# Patient Record
Sex: Female | Born: 1990 | Hispanic: Yes | Marital: Single
Health system: Southern US, Community
[De-identification: ages and names within clinical notes are randomized; demographics above are authoritative.]

---

## 2015-08-16 ENCOUNTER — Emergency Department (HOSPITAL_COMMUNITY): Payer: Self-pay

## 2015-08-16 ENCOUNTER — Emergency Department (HOSPITAL_COMMUNITY)
Admission: EM | Admit: 2015-08-16 | Discharge: 2015-08-16 | Disposition: A | Discharge status: Home / Self Care | Payer: Self-pay | Attending: Emergency Medicine | Admitting: Emergency Medicine

## 2015-08-16 DIAGNOSIS — M62830 Muscle spasm of back: Secondary | ICD-10-CM | POA: Insufficient documentation

## 2015-08-16 DIAGNOSIS — Z3202 Encounter for pregnancy test, result negative: Secondary | ICD-10-CM | POA: Insufficient documentation

## 2015-08-16 LAB — URINALYSIS, ROUTINE W REFLEX MICROSCOPIC
BILIRUBIN URINE: NEGATIVE
Glucose, UA: NEGATIVE mg/dL
Ketones, ur: NEGATIVE mg/dL
NITRITE: NEGATIVE
Protein, ur: NEGATIVE mg/dL
SPECIFIC GRAVITY, URINE: 1.021 (ref 1.005–1.030)
pH: 5 (ref 5.0–8.0)

## 2015-08-16 LAB — CBC WITH DIFFERENTIAL/PLATELET
Basophils Absolute: 0 10*3/uL (ref 0.0–0.1)
Basophils Relative: 1 %
EOS PCT: 4 %
Eosinophils Absolute: 0.3 10*3/uL (ref 0.0–0.7)
HCT: 34.1 % — ABNORMAL LOW (ref 36.0–46.0)
Hemoglobin: 12 g/dL (ref 12.0–15.0)
LYMPHS ABS: 1.9 10*3/uL (ref 0.7–4.0)
LYMPHS PCT: 31 %
MCH: 30.5 pg (ref 26.0–34.0)
MCHC: 35.2 g/dL (ref 30.0–36.0)
MCV: 86.8 fL (ref 78.0–100.0)
MONO ABS: 0.5 10*3/uL (ref 0.1–1.0)
MONOS PCT: 8 %
Neutro Abs: 3.4 10*3/uL (ref 1.7–7.7)
Neutrophils Relative %: 56 %
PLATELETS: 250 10*3/uL (ref 150–400)
RBC: 3.93 MIL/uL (ref 3.87–5.11)
RDW: 12.1 % (ref 11.5–15.5)
WBC: 6.1 10*3/uL (ref 4.0–10.5)

## 2015-08-16 LAB — COMPREHENSIVE METABOLIC PANEL
ALBUMIN: 3.9 g/dL (ref 3.5–5.0)
ALT: 12 U/L — AB (ref 14–54)
AST: 20 U/L (ref 15–41)
Alkaline Phosphatase: 53 U/L (ref 38–126)
Anion gap: 10 (ref 5–15)
BUN: 7 mg/dL (ref 6–20)
CHLORIDE: 110 mmol/L (ref 101–111)
CO2: 22 mmol/L (ref 22–32)
Calcium: 9 mg/dL (ref 8.9–10.3)
Creatinine, Ser: 0.72 mg/dL (ref 0.44–1.00)
GFR calc Af Amer: >60 mL/min (ref >60)
GFR calc non Af Amer: >60 mL/min (ref >60)
GLUCOSE: 101 mg/dL — AB (ref 65–99)
POTASSIUM: 3.8 mmol/L (ref 3.5–5.1)
Sodium: 142 mmol/L (ref 135–145)
Total Bilirubin: 0.2 mg/dL — ABNORMAL LOW (ref 0.3–1.2)
Total Protein: 6.5 g/dL (ref 6.5–8.1)

## 2015-08-16 LAB — POC URINE PREG, ED: PREG TEST UR: NEGATIVE

## 2015-08-16 LAB — URINE MICROSCOPIC-ADD ON

## 2015-08-16 LAB — LIPASE, BLOOD: Lipase: 32 U/L (ref 11–51)

## 2015-08-16 MED ORDER — MORPHINE SULFATE (PF) 4 MG/ML IV SOLN
4.0000 mg | Freq: Once | INTRAVENOUS | Status: AC
  Administered 2015-08-16: 4 mg via INTRAVENOUS
  Filled 2015-08-16: qty 1

## 2015-08-16 MED ORDER — NAPROXEN 500 MG PO TABS: 500.0000 mg | ORAL_TABLET | Freq: Two times a day (BID) | ORAL | Status: AC

## 2015-08-16 MED ORDER — CYCLOBENZAPRINE HCL 10 MG PO TABS: 10.0000 mg | ORAL_TABLET | Freq: Two times a day (BID) | ORAL | Status: AC | PRN

## 2015-08-16 MED ORDER — ONDANSETRON HCL 4 MG/2ML IJ SOLN
4.0000 mg | Freq: Once | INTRAMUSCULAR | Status: AC
  Administered 2015-08-16: 4 mg via INTRAVENOUS
  Filled 2015-08-16: qty 2

## 2015-08-16 NOTE — ED Notes (Signed)
Sharp rt flank abd pain that has been constant since she woke up this am she is on her period now  No  Vomiting, no hx of ov ovarian cysts, last  bm was last night, denies constipation

## 2015-08-16 NOTE — Discharge Instructions (Signed)
Your CT scan was negative for kidney stones or other abnormality.  You will be discharged with antiinflammatory medication and a muscle relaxer. Please read the information below about the medications, your diagnosis, and home management. Also read the reasons to seek immediate medical care at the ER.    Back Injury Prevention Back injuries can be very painful. They can also be difficult to heal. After having one back injury, you are more likely to injure your back again. It is important to learn how to avoid injuring or re-injuring your back. The following tips can help you to prevent a back injury. WHAT SHOULD I KNOW ABOUT PHYSICAL FITNESS?  Exercise for 30 minutes per day on most days of the week or as directed by your health care provider. Make sure to:  Do aerobic exercises, such as walking, jogging, biking, or swimming.  Do exercises that increase balance and strength, such as tai chi and yoga. These can decrease your risk of falling and injuring your back.  Do stretching exercises to help with flexibility.  Try to develop strong abdominal muscles. Your abdominal muscles provide a lot of the support that is needed by your back.  Maintain a healthy weight. This helps to decrease your risk of a back injury. WHAT SHOULD I KNOW ABOUT MY DIET?  Talk with your health care provider about your overall diet. Take supplements and vitamins only as directed by your health care provider.  Talk with your health care provider about how much calcium and vitamin D you need each day. These nutrients help to prevent weakening of the bones (osteoporosis). Osteoporosis can cause broken (fractured) bones, which lead to back pain.  Include good sources of calcium in your diet, such as dairy products, green leafy vegetables, and products that have had calcium added to them (fortified).  Include good sources of vitamin D in your diet, such as milk and foods that are fortified with vitamin D. WHAT SHOULD I  KNOW ABOUT MY POSTURE?  Sit up straight and stand up straight. Avoid leaning forward when you sit or hunching over when you stand.  Choose chairs that have good low-back (lumbar) support.  If you work at a desk, sit close to it so you do not need to lean over. Keep your chin tucked in. Keep your neck drawn back, and keep your elbows bent at a right angle. Your arms should look like the letter "L."  Sit high and close to the steering wheel when you drive. Add a lumbar support to your car seat, if needed.  Avoid sitting or standing in one position for very long. Take breaks to get up, stretch, and walk around at least one time every hour. Take breaks every hour if you are driving for long periods of time.  Sleep on your side with your knees slightly bent, or sleep on your back with a pillow under your knees. Do not lie on the front of your body to sleep. WHAT SHOULD I KNOW ABOUT LIFTING, TWISTING, AND REACHING? Lifting and Heavy Lifting  Avoid heavy lifting, especially repetitive heavy lifting. If you must do heavy lifting:  Stretch before lifting.  Work slowly.  Rest between lifts.  Use a tool such as a cart or a dolly to move objects if one is available.  Make several small trips instead of carrying one heavy load.  Ask for help when you need it, especially when moving big objects.  Follow these steps when lifting:  Stand with your feet  shoulder-width apart.  Get as close to the object as you can. Do not try to pick up a heavy object that is far from your body.  Use handles or lifting straps if they are available.  Bend at your knees. Squat down, but keep your heels off the floor.  Keep your shoulders pulled back, your chin tucked in, and your back straight.  Lift the object slowly while you tighten the muscles in your legs, abdomen, and buttocks. Keep the object as close to the center of your body as possible.  Follow these steps when putting down a heavy load:  Stand  with your feet shoulder-width apart.  Lower the object slowly while you tighten the muscles in your legs, abdomen, and buttocks. Keep the object as close to the center of your body as possible.  Keep your shoulders pulled back, your chin tucked in, and your back straight.  Bend at your knees. Squat down, but keep your heels off the floor.  Use handles or lifting straps if they are available. Twisting and Reaching  Avoid lifting heavy objects above your waist.  Do not twist at your waist while you are lifting or carrying a load. If you need to turn, move your feet.  Do not bend over without bending at your knees.  Avoid reaching over your head, across a table, or for an object on a high surface. WHAT ARE SOME OTHER TIPS? 1. Avoid wet floors and icy ground. Keep sidewalks clear of ice to prevent falls. 2. Do not sleep on a mattress that is too soft or too hard. 3. Keep items that are used frequently within easy reach. 4. Put heavier objects on shelves at waist level, and put lighter objects on lower or higher shelves. 5. Find ways to decrease your stress, such as exercise, massage, or relaxation techniques. Stress can build up in your muscles. Tense muscles are more vulnerable to injury. 6. Talk with your health care provider if you feel anxious or depressed. These conditions can make back pain worse. 7. Wear flat heel shoes with cushioned soles. 8. Avoid sudden movements. 9. Use both shoulder straps when carrying a backpack. 10. Do not use any tobacco products, including cigarettes, chewing tobacco, or electronic cigarettes. If you need help quitting, ask your health care provider.   This information is not intended to replace advice given to you by your health care provider. Make sure you discuss any questions you have with your health care provider.   Document Released: 06/22/2004 Document Revised: 09/29/2014 Document Reviewed: 05/19/2014 Elsevier Interactive Patient Education 2016  [employer]  Back Exercises The following exercises strengthen the muscles that help to support the back. They also help to keep the lower back flexible. Doing these exercises can help to prevent back pain or lessen existing pain. If you have back pain or discomfort, try doing these exercises 2-3 times each day or as told by your health care provider. When the pain goes away, do them once each day, but increase the number of times that you repeat the steps for each exercise (do more repetitions). If you do not have back pain or discomfort, do these exercises once each day or as told by your health care provider. EXERCISES Single Knee to Chest Repeat these steps 3-5 times for each leg:  Lie on your back on a firm bed or the floor with your legs extended.  Bring one knee to your chest. Your other leg should stay extended and in contact  with the floor.  Hold your knee in place by grabbing your knee or thigh.  Pull on your knee until you feel a gentle stretch in your lower back.  Hold the stretch for 10-30 seconds.  Slowly release and straighten your leg. Pelvic Tilt Repeat these steps 5-10 times:  Lie on your back on a firm bed or the floor with your legs extended.  Bend your knees so they are pointing toward the ceiling and your feet are flat on the floor.  Tighten your lower abdominal muscles to press your lower back against the floor. This motion will tilt your pelvis so your tailbone points up toward the ceiling instead of pointing to your feet or the floor.  With gentle tension and even breathing, hold this position for 5-10 seconds. Cat-Cow Repeat these steps until your lower back becomes more flexible:  Get into a hands-and-knees position on a firm surface. Keep your hands under your shoulders, and keep your knees under your hips. You may place padding under your knees for comfort.  Let your head hang down, and point your tailbone toward the floor so your lower back becomes  rounded like the back of a cat.  Hold this position for 5 seconds.  Slowly lift your head and point your tailbone up toward the ceiling so your back forms a sagging arch like the back of a cow.  Hold this position for 5 seconds. Press-Ups Repeat these steps 5-10 times:  Lie on your abdomen (face-down) on the floor.  Place your palms near your head, about shoulder-width apart.  While you keep your back as relaxed as possible and keep your hips on the floor, slowly straighten your arms to raise the top half of your body and lift your shoulders. Do not use your back muscles to raise your upper torso. You may adjust the placement of your hands to make yourself more comfortable.  Hold this position for 5 seconds while you keep your back relaxed.  Slowly return to lying flat on the floor. Bridges Repeat these steps 10 times:  Lie on your back on a firm surface.  Bend your knees so they are pointing toward the ceiling and your feet are flat on the floor.  Tighten your buttocks muscles and lift your buttocks off of the floor until your waist is at almost the same height as your knees. You should feel the muscles working in your buttocks and the back of your thighs. If you do not feel these muscles, slide your feet 1-2 inches farther away from your buttocks.  Hold this position for 3-5 seconds.  Slowly lower your hips to the starting position, and allow your buttocks muscles to relax completely. If this exercise is too easy, try doing it with your arms crossed over your chest. Abdominal Crunches Repeat these steps 5-10 times: 11. Lie on your back on a firm bed or the floor with your legs extended. 12. Bend your knees so they are pointing toward the ceiling and your feet are flat on the floor. 41. Cross your arms over your chest. 14. Tip your chin slightly toward your chest without bending your neck. 20. Tighten your abdominal muscles and slowly raise your trunk (torso) high enough to  lift your shoulder blades a tiny bit off of the floor. Avoid raising your torso higher than that, because it can put too much stress on your low back and it does not help to strengthen your abdominal muscles. 16. Slowly return to your  starting position. Back Lifts Repeat these steps 5-10 times: 1. Lie on your abdomen (face-down) with your arms at your sides, and rest your forehead on the floor. 2. Tighten the muscles in your legs and your buttocks. 3. Slowly lift your chest off of the floor while you keep your hips pressed to the floor. Keep the back of your head in line with the curve in your back. Your eyes should be looking at the floor. 4. Hold this position for 3-5 seconds. 5. Slowly return to your starting position. SEEK MEDICAL CARE IF:  Your back pain or discomfort gets much worse when you do an exercise.  Your back pain or discomfort does not lessen within 2 hours after you exercise. If you have any of these problems, stop doing these exercises right away. Do not do them again unless your health care provider says that you can. SEEK IMMEDIATE MEDICAL CARE IF:  You develop sudden, severe back pain. If this happens, stop doing the exercises right away. Do not do them again unless your health care provider says that you can.   This information is not intended to replace advice given to you by your health care provider. Make sure you discuss any questions you have with your health care provider.   Document Released: 06/22/2004 Document Revised: 02/03/2015 Document Reviewed: 07/09/2014 Elsevier Interactive Patient Education 2016 [place] therapy can help ease sore, stiff, injured, and tight muscles and joints. Heat relaxes your muscles, which may help ease your pain.  RISKS AND COMPLICATIONS If you have any of the following conditions, do not use heat therapy unless your health care provider has approved:  Poor circulation.  Healing wounds or scarred skin  in the area being treated.  Diabetes, heart disease, or high blood pressure.  Not being able to feel (numbness) the area being treated.  Unusual swelling of the area being treated.  Active infections.  Blood clots.  Cancer.  Inability to communicate pain. This may include young children and people who have problems with their brain function (dementia).  Pregnancy. Heat therapy should only be used on old, pre-existing, or long-lasting (chronic) injuries. Do not use heat therapy on new injuries unless directed by your health care provider. HOW TO USE HEAT THERAPY There are several different kinds of heat therapy, including:  Moist heat pack.  Warm water bath.  Hot water bottle.  Electric heating pad.  Heated gel pack.  Heated wrap.  Electric heating pad. Use the heat therapy method suggested by your health care provider. Follow your health care provider's instructions on when and how to use heat therapy. GENERAL HEAT THERAPY RECOMMENDATIONS  Do not sleep while using heat therapy. Only use heat therapy while you are awake.  Your skin may turn pink while using heat therapy. Do not use heat therapy if your skin turns red.  Do not use heat therapy if you have new pain.  High heat or long exposure to heat can cause burns. Be careful when using heat therapy to avoid burning your skin.  Do not use heat therapy on areas of your skin that are already irritated, such as with a rash or sunburn. SEEK MEDICAL CARE IF:  You have blisters, redness, swelling, or numbness.  You have new pain.  Your pain is worse. MAKE SURE YOU:  Understand these instructions.  Will watch your condition.  Will get help right away if you are not doing well or get worse.   This information  is not intended to replace advice given to you by your health care provider. Make sure you discuss any questions you have with your health care provider.   Document Released: 08/07/2011 Document Revised:  06/05/2014 Document Reviewed: 07/08/2013 Elsevier Interactive Patient Education 2016 [employer] Naproxen and naproxen sodium oral immediate-release tablets What is this medicine? NAPROXEN (na PROX en) is a non-steroidal anti-inflammatory drug (NSAID). It is used to reduce swelling and to treat pain. This medicine may be used for dental pain, headache, or painful monthly periods. It is also used for painful joint and muscular problems such as arthritis, tendinitis, bursitis, and gout. This medicine may be used for other purposes; ask your health care provider or pharmacist if you have questions. What should I tell my health care provider before I take this medicine? They need to know if you have any of these conditions: -asthma -cigarette smoker -drink more than 3 alcohol containing drinks a day -heart disease or circulation problems such as heart failure or leg edema (fluid retention) -high blood pressure -kidney disease -liver disease -stomach bleeding or ulcers -an unusual or allergic reaction to naproxen, aspirin, other NSAIDs, other medicines, foods, dyes, or preservatives -pregnant or trying to get pregnant -breast-feeding How should I use this medicine? Take this medicine by mouth with a glass of water. Follow the directions on the prescription label. Take it with food if your stomach gets upset. Try to not lie down for at least 10 minutes after you take it. Take your medicine at regular intervals. Do not take your medicine more often than directed. Long-term, continuous use may increase the risk of heart attack or stroke. A special MedGuide will be given to you by the pharmacist with each prescription and refill. Be sure to read this information carefully each time. Talk to your pediatrician regarding the use of this medicine in children. Special care may be needed. Overdosage: If you think you have taken too much of this medicine contact a poison control center or emergency room at  once. NOTE: This medicine is only for you. Do not share this medicine with others. What if I miss a dose? If you miss a dose, take it as soon as you can. If it is almost time for your next dose, take only that dose. Do not take double or extra doses. What may interact with this medicine? -alcohol -aspirin -cidofovir -diuretics -lithium -methotrexate -other drugs for inflammation like ketorolac or prednisone -pemetrexed -probenecid -warfarin This list may not describe all possible interactions. Give your health care provider a list of all the medicines, herbs, non-prescription drugs, or dietary supplements you use. Also tell them if you smoke, drink alcohol, or use illegal drugs. Some items may interact with your medicine. What should I watch for while using this medicine? Tell your doctor or health care professional if your pain does not get better. Talk to your doctor before taking another medicine for pain. Do not treat yourself. This medicine does not prevent heart attack or stroke. In fact, this medicine may increase the chance of a heart attack or stroke. The chance may increase with longer use of this medicine and in people who have heart disease. If you take aspirin to prevent heart attack or stroke, talk with your doctor or health care professional. Do not take other medicines that contain aspirin, ibuprofen, or naproxen with this medicine. Side effects such as stomach upset, nausea, or ulcers may be more likely to occur. Many medicines available without a prescription should  not be taken with this medicine. This medicine can cause ulcers and bleeding in the stomach and intestines at any time during treatment. Do not smoke cigarettes or drink alcohol. These increase irritation to your stomach and can make it more susceptible to damage from this medicine. Ulcers and bleeding can happen without warning symptoms and can cause death. You may get drowsy or dizzy. Do not drive, use machinery,  or do anything that needs mental alertness until you know how this medicine affects you. Do not stand or sit up quickly, especially if you are an older patient. This reduces the risk of dizzy or fainting spells. This medicine can cause you to bleed more easily. Try to avoid damage to your teeth and gums when you brush or floss your teeth. What side effects may I notice from receiving this medicine? Side effects that you should report to your doctor or health care professional as soon as possible: -black or bloody stools, blood in the urine or vomit -blurred vision -chest pain -difficulty breathing or wheezing -nausea or vomiting -severe stomach pain -skin rash, skin redness, blistering or peeling skin, hives, or itching -slurred speech or weakness on one side of the body -swelling of eyelids, throat, lips -unexplained weight gain or swelling -unusually weak or tired -yellowing of eyes or skin Side effects that usually do not require medical attention (report to your doctor or health care professional if they continue or are bothersome): -constipation -headache -heartburn This list may not describe all possible side effects. Call your doctor for medical advice about side effects. You may report side effects to FDA at 1-800-FDA-1088. Where should I keep my medicine? Keep out of the reach of children. Store at room temperature between 15 and 30 degrees C (59 and 86 degrees F). Keep container tightly closed. Throw away any unused medicine after the expiration date. NOTE: This sheet is a summary. It may not cover all possible information. If you have questions about this medicine, talk to your doctor, pharmacist, or health care provider.    2016, [employer] Standard. (2009-05-17 20:10:16) Cyclobenzaprine tablets What is this medicine? CYCLOBENZAPRINE (sye kloe BEN za preen) is a muscle relaxer. It is used to treat muscle pain, spasms, and stiffness. This medicine may be used for other  purposes; ask your health care provider or pharmacist if you have questions. What should I tell my health care provider before I take this medicine? They need to know if you have any of these conditions: -heart disease, irregular heartbeat, or previous heart attack -liver disease -thyroid problem -an unusual or allergic reaction to cyclobenzaprine, tricyclic antidepressants, lactose, other medicines, foods, dyes, or preservatives -pregnant or trying to get pregnant -breast-feeding How should I use this medicine? Take this medicine by mouth with a glass of water. Follow the directions on the prescription label. If this medicine upsets your stomach, take it with food or milk. Take your medicine at regular intervals. Do not take it more often than directed. Talk to your pediatrician regarding the use of this medicine in children. Special care may be needed. Overdosage: If you think you have taken too much of this medicine contact a poison control center or emergency room at once. NOTE: This medicine is only for you. Do not share this medicine with others. What if I miss a dose? If you miss a dose, take it as soon as you can. If it is almost time for your next dose, take only that dose. Do not take double or extra  doses. What may interact with this medicine? Do not take this medicine with any of the following medications: -certain medicines for fungal infections like fluconazole, itraconazole, ketoconazole, posaconazole, voriconazole -cisapride -dofetilide -dronedarone -droperidol -flecainide -grepafloxacin -halofantrine -levomethadyl -MAOIs like Carbex, Eldepryl, Marplan, Nardil, and Parnate -nilotinib -pimozide -probucol -sertindole -thioridazine -ziprasidone This medicine may also interact with the following medications: -abarelix -alcohol -certain medicines for cancer -certain medicines for depression, anxiety, or psychotic disturbances -certain medicines for infection like  alfuzosin, chloroquine, clarithromycin, levofloxacin, mefloquine, pentamidine, troleandomycin -certain medicines for an irregular heart beat -certain medicines used for sleep or numbness during surgery or procedure -contrast dyes -dolasetron -guanethidine -methadone -octreotide -ondansetron -other medicines that prolong the QT interval (cause an abnormal heart rhythm) -palonosetron -phenothiazines like chlorpromazine, mesoridazine, prochlorperazine, thioridazine -tramadol -vardenafil This list may not describe all possible interactions. Give your health care provider a list of all the medicines, herbs, non-prescription drugs, or dietary supplements you use. Also tell them if you smoke, drink alcohol, or use illegal drugs. Some items may interact with your medicine. What should I watch for while using this medicine? Check with your doctor or health care professional if your condition does not improve within 1 to 3 weeks. You may get drowsy or dizzy when you first start taking the medicine or change doses. Do not drive, use machinery, or do anything that may be dangerous until you know how the medicine affects you. Stand or sit up slowly. Your mouth may get dry. Drinking water, chewing sugarless gum, or sucking on hard candy may help. What side effects may I notice from receiving this medicine? Side effects that you should report to your doctor or health care professional as soon as possible: -allergic reactions like skin rash, itching or hives, swelling of the face, lips, or tongue -chest pain -fast heartbeat -hallucinations -seizures -vomiting Side effects that usually do not require medical attention (report to your doctor or health care professional if they continue or are bothersome): -headache This list may not describe all possible side effects. Call your doctor for medical advice about side effects. You may report side effects to FDA at 1-800-FDA-1088. Where should I keep my  medicine? Keep out of the reach of children. Store at room temperature between 15 and 30 degrees C (59 and 86 degrees F). Keep container tightly closed. Throw away any unused medicine after the expiration date. NOTE: This sheet is a summary. It may not cover all possible information. If you have questions about this medicine, talk to your doctor, pharmacist, or health care provider.    2016, [employer]. (2012-12-10 12:48:19)

## 2015-08-16 NOTE — ED Provider Notes (Signed)
CSN: [id number]     Arrival date & time 08/16/15  0706 History   First MD Initiated Contact with Patient 08/16/15 [phone]     Chief Complaint  Patient presents with  . Flank Pain     (Consider location/radiation/quality/duration/timing/severity/associated sxs/prior Treatment) HPI  Wendy Guerrero Is a 25 year old female who presents emergency Department with chief complaint of right flank pain. Patient states that she was rolling to the left side this morning in her bed when she had sudden onset severe right-sided flank pain which she described as 10 out of 10 sharp pain "like a knife stabbing in my back." It did not radiate. Patient states that she was unable to move or stand. She cannot get a comfortable position. Patient states that he has eased up some, but she continues to have some pain there. She denies any urinary or vaginal symptoms. She denies abdominal pain, constipation, diarrhea, chills, fevers. She denies a history of kidney stones. She did not take any medication for pain prior to arriving at the emergency department. Patient states that her last menstrual period began about 5 days ago No past medical history on file. No past surgical history on file. No family history on file. Social History  Substance Use Topics  . Smoking status: Not on file  . Smokeless tobacco: Not on file  . Alcohol Use: Not on file   OB History    No data available     Review of Systems  Ten systems reviewed and are negative for acute change, except as noted in the HPI.    Allergies  Review of patient's allergies indicates no known allergies.  Home Medications   Prior to Admission medications   Not on File   BP 100/74 mmHg  Pulse 52  Temp(Src) 98 F (36.7 C) (Oral)  Resp 18  Wt 60.527 kg  SpO2 100%  LMP  Physical Exam Physical Exam  Nursing note and vitals reviewed. Constitutional: She is oriented to person, place, and time. She appears well-developed and well-nourished. No  distress.  HENT:  Head: Normocephalic and atraumatic.  Eyes: Conjunctivae normal and EOM are normal. Pupils are equal, round, and reactive to light. No scleral icterus.  Neck: Normal range of motion.  Cardiovascular: Normal rate, regular rhythm and normal heart sounds.  Exam reveals no gallop and no friction rub.   No murmur heard. Pulmonary/Chest: Effort normal and breath sounds normal. No respiratory distress.  Abdominal: Soft. Bowel sounds are normal. She exhibits no distension and no mass. There is no tenderness. There is no guarding.  Musculoskeletal: Tender to palpation in the right lumbar paraspinal muscles. No CVA tenderness.  Neurological: She is alert and oriented to person, place, and time.  Skin: Skin is warm and dry. She is not diaphoretic.    ED Course  Procedures (including critical care time) Labs Review Labs Reviewed  URINALYSIS, ROUTINE W REFLEX MICROSCOPIC (NOT [facility]) - Abnormal; Notable for the following:    APPearance CLOUDY (*)    Hgb urine dipstick LARGE (*)    Leukocytes, UA MODERATE (*)    All other components within normal limits  CBC WITH DIFFERENTIAL/PLATELET - Abnormal; Notable for the following:    HCT 34.1 (*)    All other components within normal limits  URINE MICROSCOPIC-ADD ON - Abnormal; Notable for the following:    Squamous Epithelial / LPF TOO NUMEROUS TO COUNT (*)    Bacteria, UA MANY (*)    All other components within normal limits  COMPREHENSIVE  METABOLIC PANEL  LIPASE, BLOOD  POC URINE PREG, ED    Imaging Review No results found. I have personally reviewed and evaluated these images and lab results as part of my medical decision-making.   EKG Interpretation None      MDM   Final diagnoses:  Muscle spasm of back    8:45 AM BP 100/74 mmHg  Pulse 52  Temp(Src) 98 F (36.7 C) (Oral)  Resp 18  Wt 60.527 kg  SpO2 100%  LMP  Patient with UA, which appears either heavily contaminated or to be infected. I suspect the  patient has contamination from the end of her menstrual cycle and vaginal contaminants. Patient also has reproducible pain with palpation of the lumbar paraspinals and I suspect this is acute muscle spasm, however, we'll evaluate for a stone given the high level of blood in her urine. Currently awaiting CT results. Pain is well controlled at this time.   9:24 AM CT scan negative for any renal stones or cause of her pain. Again, I suspect this is a muscle spasm. Patient will be discharged with naproxen and Flexeril. I have given the patient's discharge information for home management, medications, and return precautions. Patient's urine has been sent for culture. She'll be contacted through our culture follow-up. If positive, and need for treatment. She appears safe for discharge at this time  Arthor Captain, PA-C 08/16/15 [id number]  Melene Plan, DO 08/16/15 [phone]

## 2015-08-16 NOTE — ED Notes (Signed)
Pt reports woke up with right flank pain. Pt denies N/V or urinary symptoms.

## 2015-08-17 LAB — URINE CULTURE

## 2016-12-23 IMAGING — CT CT RENAL STONE PROTOCOL
2 of 3 series · 16 of 46 positions shown, 18 images · non-contrast
Comparison: None.

CLINICAL DATA: Right flank pain beginning this morning. No known
injury. Initial encounter.

EXAM:
CT ABDOMEN AND PELVIS WITHOUT CONTRAST
TECHNIQUE: Multidetector CT imaging of the abdomen and pelvis was performed
following the standard protocol without IV contrast.

[Series 2: renal stone 5mm · axial · 0.67mm/px · z∈[-451,-81]mm · 13 of 86 slices shown, 15 images]
[im 6/86  soft-tissue]
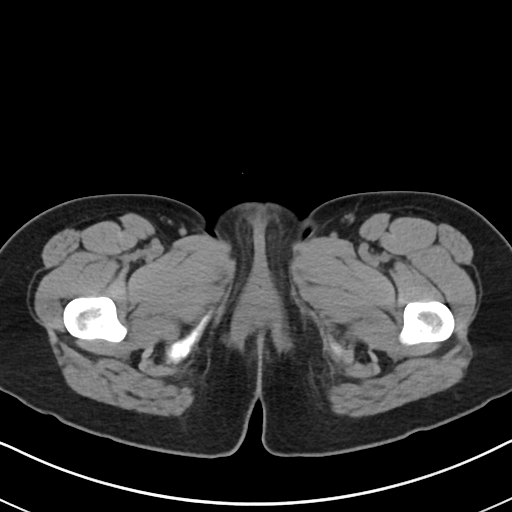
[im 6/86  bone]
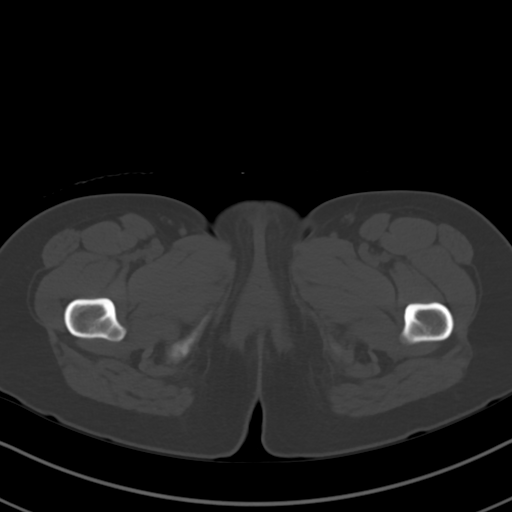
[im 11/86  soft-tissue]
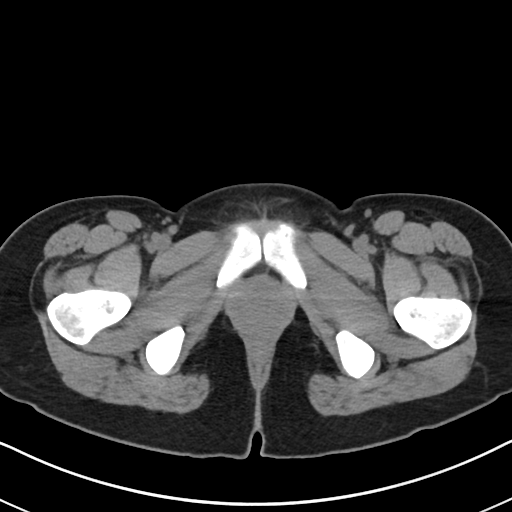
[im 17/86  soft-tissue]
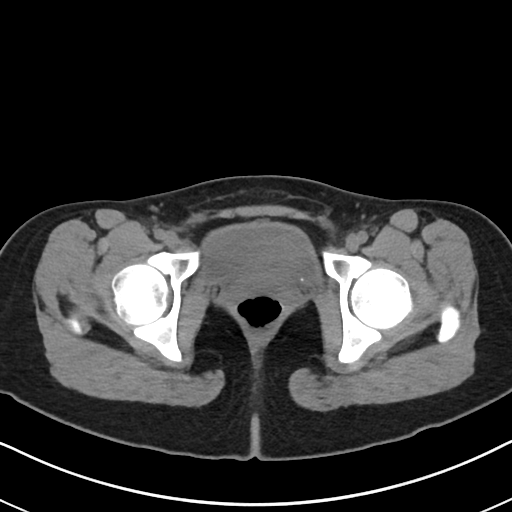
[im 25/86  soft-tissue]
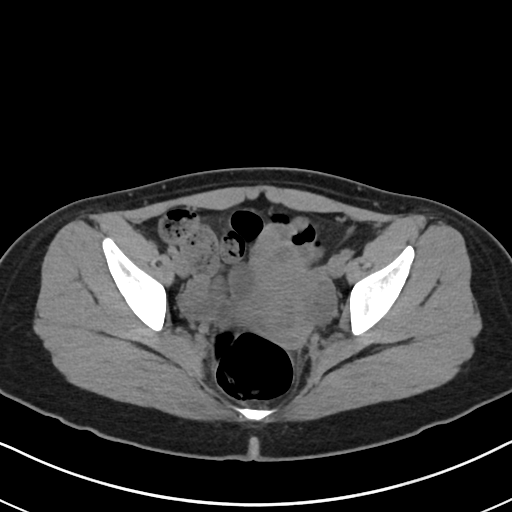
[im 31/86  soft-tissue]
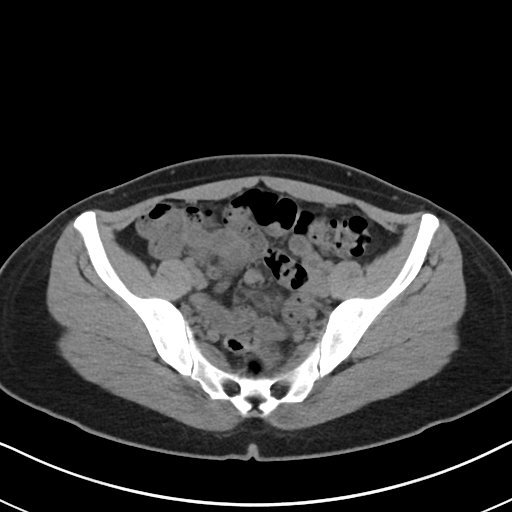
[im 36/86  soft-tissue]
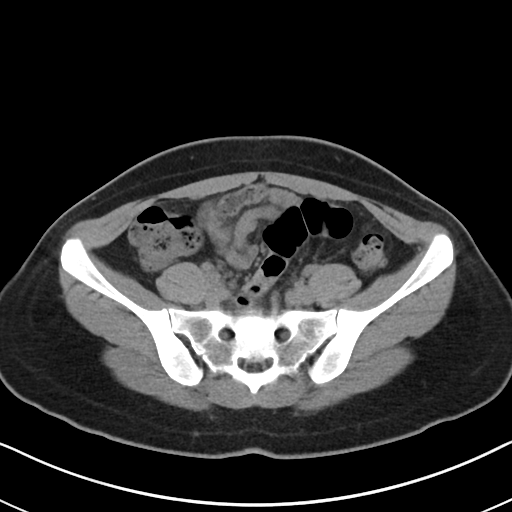
[im 44/86  soft-tissue]
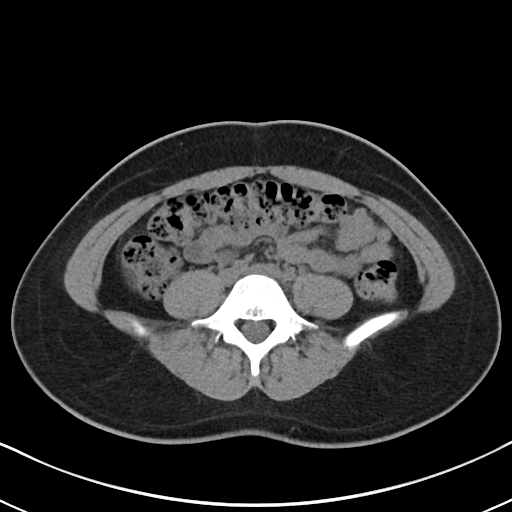
[im 50/86  soft-tissue]
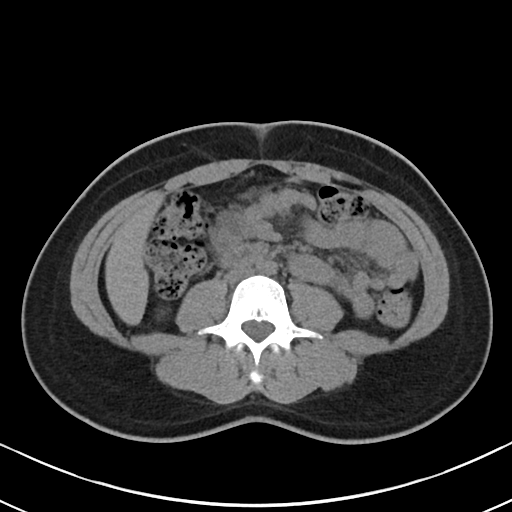
[im 55/86  soft-tissue]
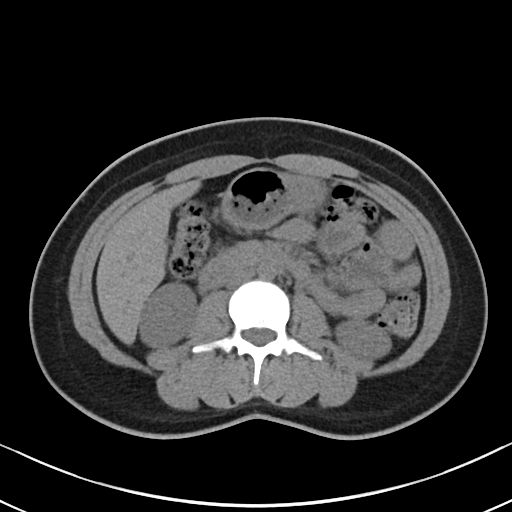
[im 55/86  bone]
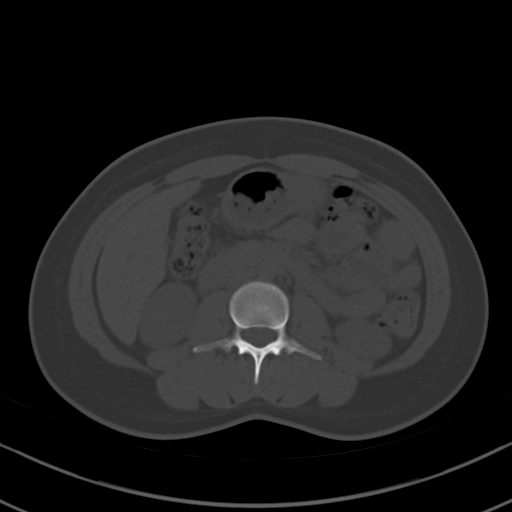
[im 61/86  soft-tissue]
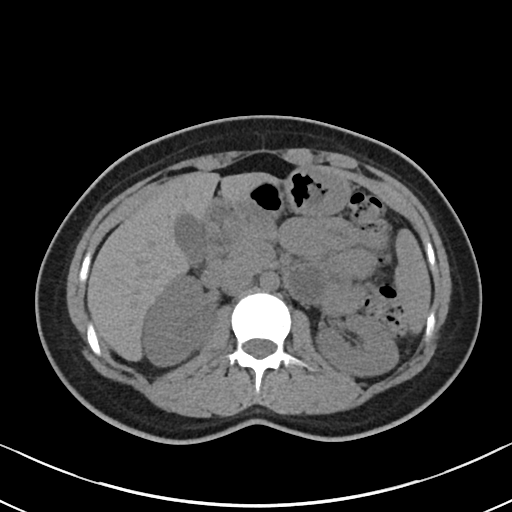
[im 69/86  soft-tissue]
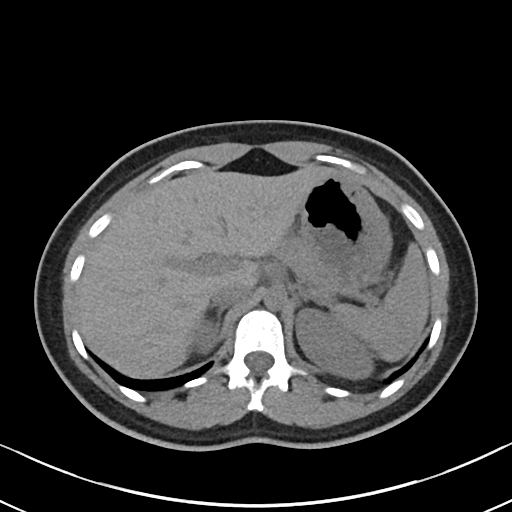
[im 75/86  soft-tissue]
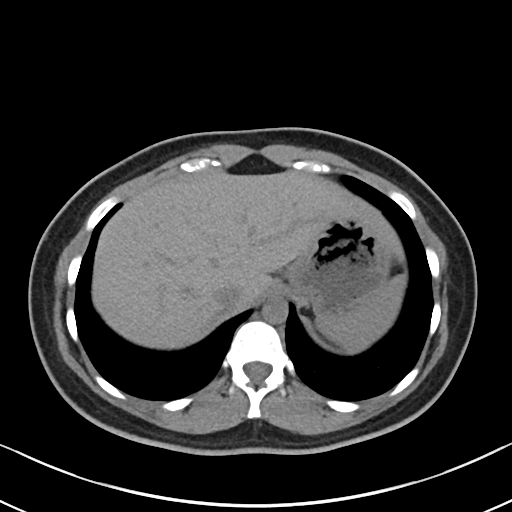
[im 80/86  soft-tissue]
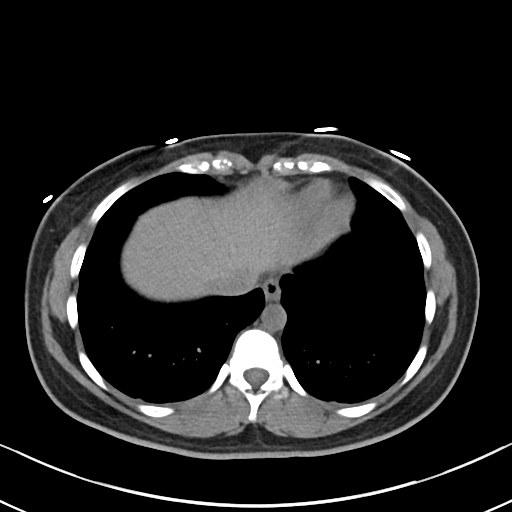

[Series 4: renal stone 3.0 cor · coronal · 0.59mm/px · 3 of 83 slices shown]
[im 28/83  soft-tissue]
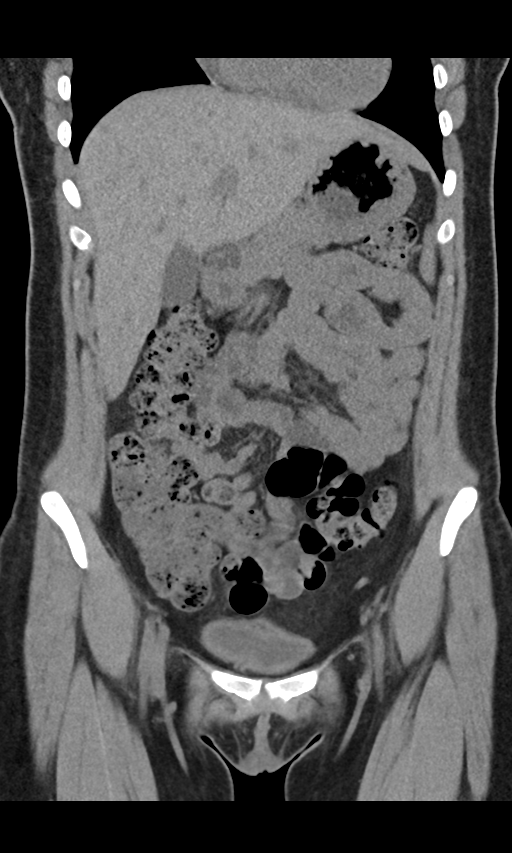
[im 37/83  soft-tissue]
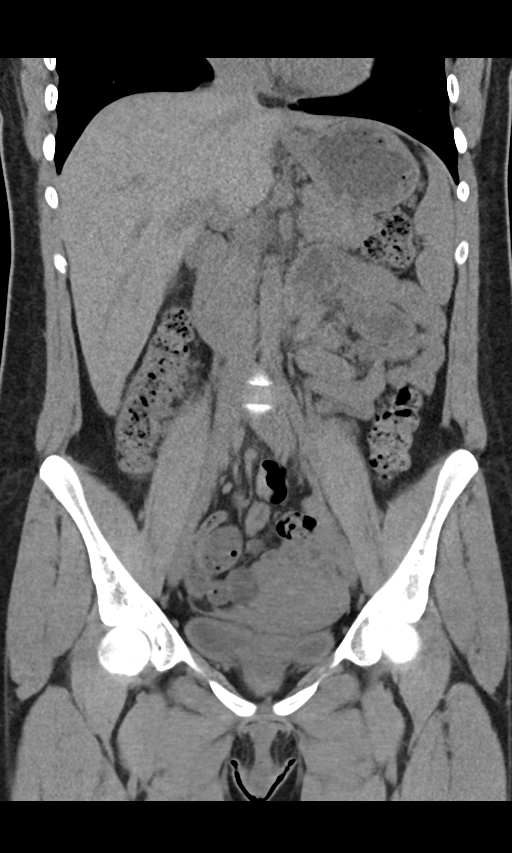
[im 46/83  soft-tissue]
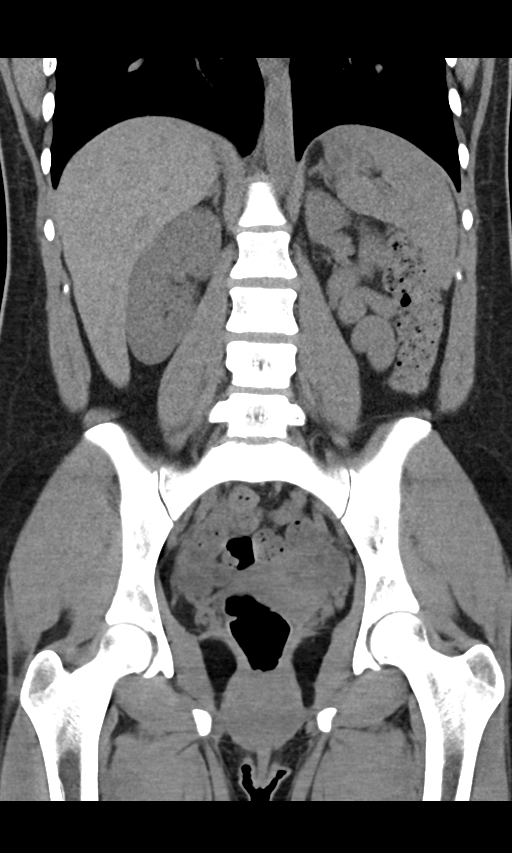

[16 of 46 positions shown; findings below may reference images not displayed]

FINDINGS: The lung bases are clear.  No pneumothorax or pleural effusion.

There are no renal or ureteral stones. The kidneys, ureters and
urinary bladder all appear normal. The liver, spleen, gallbladder,
pancreas and adrenal glands all appear normal. The stomach, small
and large bowel and appendix appear normal. Uterus, adnexa and
urinary bladder are unremarkable. No focal bony abnormality.
IMPRESSION: Negative for urinary tract stone.  Negative CT abdomen and pelvis.
